# Patient Record
Sex: Female | Born: 1955 | Race: Black or African American | Hispanic: No | Marital: Married | Smoking: Never smoker
Health system: Southern US, Community
[De-identification: ages and names within clinical notes are randomized; demographics above are authoritative.]

## PROBLEM LIST (undated history)

## (undated) DIAGNOSIS — R51 Headache: Secondary | ICD-10-CM

## (undated) DIAGNOSIS — J309 Allergic rhinitis, unspecified: Secondary | ICD-10-CM

## (undated) DIAGNOSIS — Z87442 Personal history of urinary calculi: Secondary | ICD-10-CM

## (undated) HISTORY — DX: Personal history of urinary calculi: Z87.442

## (undated) HISTORY — DX: Headache: R51

## (undated) HISTORY — PX: CATARACT EXTRACTION: SUR2

## (undated) HISTORY — DX: Allergic rhinitis, unspecified: J30.9

---

## 1982-04-16 DIAGNOSIS — Z87442 Personal history of urinary calculi: Secondary | ICD-10-CM

## 1982-04-16 HISTORY — DX: Personal history of urinary calculi: Z87.442

## 2009-12-09 ENCOUNTER — Ambulatory Visit: Payer: Self-pay | Admitting: Family Medicine

## 2009-12-09 DIAGNOSIS — R519 Headache, unspecified: Secondary | ICD-10-CM | POA: Insufficient documentation

## 2009-12-09 DIAGNOSIS — Z87442 Personal history of urinary calculi: Secondary | ICD-10-CM | POA: Insufficient documentation

## 2009-12-09 DIAGNOSIS — R51 Headache: Secondary | ICD-10-CM | POA: Insufficient documentation

## 2009-12-09 DIAGNOSIS — J309 Allergic rhinitis, unspecified: Secondary | ICD-10-CM | POA: Insufficient documentation

## 2009-12-09 LAB — CONVERTED CEMR LAB
Cholesterol: 150 mg/dL (ref 0–200)
HDL: 54.2 mg/dL (ref >39.00)
LDL Cholesterol: 91 mg/dL (ref 0–99)
Triglycerides: 22 mg/dL (ref 0.0–149.0)

## 2009-12-26 ENCOUNTER — Encounter: Admission: RE | Admit: 2009-12-26 | Discharge: 2009-12-26 | Payer: Self-pay | Admitting: Family Medicine

## 2009-12-28 ENCOUNTER — Encounter (INDEPENDENT_AMBULATORY_CARE_PROVIDER_SITE_OTHER): Payer: Self-pay | Admitting: *Deleted

## 2010-05-16 NOTE — Letter (Signed)
Summary: Results Follow up Letter  [place] at [facility]  [address] [place], [place] [address]   Phone: [phone]  Fax: [phone]    12/28/2009 MRN: [id number]  Emily Preston [address] [place], [place]  [address]  Dear Ms. Woolverton,  The following are the results of your recent test(s):  Test         Result    Pap Smear:        Normal _____  Not Normal _____ Comments: ______________________________________________________ Cholesterol: LDL(Bad cholesterol):         Your goal is less than:         HDL (Good cholesterol):       Your goal is more than: Comments:  ______________________________________________________ Mammogram:        Normal __X___  Not Normal _____ Comments:Repeat in 1 year  ___________________________________________________________________ Hemoccult:        Normal _____  Not normal _______ Comments:    _____________________________________________________________________ Other Tests:    We routinely do not discuss normal results over the telephone.  If you desire a copy of the results, or you have any questions about this information we can discuss them at your next office visit.   Sincerely,      Kim Dance,CMA(AAMA)for Dr. Raechel Ache

## 2010-05-16 NOTE — Assessment & Plan Note (Signed)
Summary: NEW PT TO BE ESTABLISHED/JRR   Vital Signs:  Patient profile:   55 year old female LMP:     11/07/2009 Height:      60 inches Weight:      226.50 pounds BMI:     44.40 Temp:     98.4 degrees F oral Pulse rate:   88 / minute Pulse rhythm:   regular BP sitting:   132 / 90  (left arm) Cuff size:   large  Vitals Entered By: Delilah Shan CMA Duncan Dull) (December 09, 2009 10:06 AM) CC: New Patient to Establish LMP (date): 11/07/2009     Enter LMP: 11/07/2009   History of Present Illness: NP- Allergies and "sinus trouble".  No FCNAV, no ear pain.  Congested and HA intermittently but worse this summer.   FH CAD.  Pt hasn't seen MD in years.  Hasn't had chol/glucose checked.  No CP, no personal h/o CAD.  Due for mammogram.    Tdap and flu shots d/w patient.   Preventive Screening-Counseling & Management  Alcohol-Tobacco     Smoking Status: never  Caffeine-Diet-Exercise     Does Patient Exercise: no  Problems Prior to Update: 1)  Other Screening Mammogram  (ICD-V76.12) 2)  Family History of Ischemic Heart Disease  (ICD-V17.3) 3)  Nephrolithiasis, Hx of  (ICD-V13.01) 4)  Headache  (ICD-784.0) 5)  Allergic Rhinitis  (ICD-477.9)  Allergies (verified): No Known Drug Allergies  Past History:  Family History: Last updated: 12/09/2009 Family History Hypertension, parents F dead, HTN M dead, leg ulcers with skin grafts, "heart trouble"  Social History: Last updated: 12/09/2009 Marital Status: Married, 1992 Children: no Occupation: Public house manager, 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 Family History Hypertension, parents F dead, HTN M  dead, leg ulcers with skin grafts, "heart trouble"  Social History: Reviewed history and no changes required. 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  Otherwise negative.    Physical Exam  General:  GEN: nad, alert and oriented HEENT: mucous membranes moist, TM wnl bilaterally, nasal epithelium injected. OP wnl.  NECK: supple w/o LA CV: rrr.  no murmur PULM: ctab, no inc wob ABD: soft, +bs EXT: trace edema at ankles SKIN: no acute rash    Impression & Recommendations:  Problem # 1:  ALLERGIC RHINITIS (ICD-477.9) D/w patient re: zyrtec and nasal saline.  consider nasal steroids if not improving.    Problem # 2:  FAMILY HISTORY OF ISCHEMIC HEART DISEASE (ICD-V17.3) contact with labs.  Orders: TLB-Lipid Panel ([id number]) TLB-Glucose, QUANT ([id number])  Problem # 3:  OTHER SCREENING MAMMOGRAM (ICD-V76.12) refer for mammogram.  Orders: Radiology Referral (Radiology)  Patient Instructions: 1)  See Shirlee Limerick about your referral before your leave today.   2)  Check on your shot record.  I would get a flu shot this fall (and a tetanus shot if you haven't had one in 10 years). 3)  We'll contact you with your lab report.  4)  I would  like to see you back in the spring for a physical.  5)  I would use nasal saline and zyrtec 10mg  a day for your runny nose.  6)  Take care.   Prior Medications (reviewed today): None Current Allergies (reviewed today): No known allergies

## 2010-06-06 ENCOUNTER — Encounter: Payer: Self-pay | Admitting: Family Medicine

## 2010-07-14 ENCOUNTER — Other Ambulatory Visit: Payer: Self-pay

## 2010-07-25 ENCOUNTER — Encounter: Payer: Self-pay | Admitting: Family Medicine

## 2010-08-01 ENCOUNTER — Encounter: Payer: Self-pay | Admitting: Family Medicine

## 2011-01-16 ENCOUNTER — Other Ambulatory Visit: Payer: Self-pay | Admitting: Family Medicine

## 2011-01-16 DIAGNOSIS — Z1231 Encounter for screening mammogram for malignant neoplasm of breast: Secondary | ICD-10-CM

## 2011-01-29 ENCOUNTER — Ambulatory Visit: Payer: Self-pay

## 2011-02-09 ENCOUNTER — Inpatient Hospital Stay: Admission: RE | Admit: 2011-02-09 | Payer: Self-pay | Source: Ambulatory Visit

## 2011-03-16 ENCOUNTER — Ambulatory Visit (INDEPENDENT_AMBULATORY_CARE_PROVIDER_SITE_OTHER): Payer: BC Managed Care – PPO | Admitting: Family Medicine

## 2011-03-16 ENCOUNTER — Encounter: Payer: Self-pay | Admitting: Family Medicine

## 2011-03-16 ENCOUNTER — Other Ambulatory Visit (HOSPITAL_COMMUNITY)
Admission: RE | Admit: 2011-03-16 | Discharge: 2011-03-16 | Disposition: A | Discharge status: Home / Self Care | Payer: BC Managed Care – PPO | Source: Ambulatory Visit | Attending: Family Medicine | Admitting: Family Medicine

## 2011-03-16 DIAGNOSIS — Z0000 Encounter for general adult medical examination without abnormal findings: Secondary | ICD-10-CM

## 2011-03-16 DIAGNOSIS — Z23 Encounter for immunization: Secondary | ICD-10-CM

## 2011-03-16 DIAGNOSIS — Z1159 Encounter for screening for other viral diseases: Secondary | ICD-10-CM | POA: Insufficient documentation

## 2011-03-16 DIAGNOSIS — Z01419 Encounter for gynecological examination (general) (routine) without abnormal findings: Secondary | ICD-10-CM | POA: Insufficient documentation

## 2011-03-16 NOTE — Progress Notes (Signed)
CPE- See plan.  Routine anticipatory guidance given to patient.  See health maintenance.  Feeling well.  Sugar and cholesterol were fine on last check.   Due for mammogram, she'll call about this.   Due for pap.  Colon CA screening.  D/w patient [id number] for colon cancer screening, including IFOB vs. colonoscopy.  Risks and benefits of both were discussed and patient voiced understanding.  Pt elects for: IFOB.  We talked about diet and exercise.    Tetanus- due. Flu shot- due.  She declined.  Never had a flu shot.  She is aware of risk w/o vaccination.  She'll consider.   PMH and SH reviewed  Meds, vitals, and allergies reviewed.   ROS: See HPI.  Otherwise negative.    GEN: nad, alert and oriented HEENT: mucous membranes moist NECK: supple w/o LA CV: rrr. PULM: ctab, no inc wob ABD: soft, +bs, obese EXT: no edema SKIN: no acute rash Breast exam: No mass, nodules, thickening, tenderness, bulging, retraction, inflamation, nipple discharge or skin changes noted.  No axillary or clavicular LA.  Chaperoned exam.  Normal introitus for age, no external lesions, no vaginal discharge, mucosa pink and moist, no vaginal or cervical lesions (cervix was retroverted), no vaginal atrophy, no friaility or hemorrhage, normal uterus size and position, no adnexal masses or tenderness.  Chaperoned exam.  Pap collected.

## 2011-03-16 NOTE — Progress Notes (Signed)
Addended by: Annamarie Major on: 03/16/2011 01:13 PM   Modules accepted: Orders

## 2011-03-16 NOTE — Assessment & Plan Note (Signed)
IFOB sent with pt today.  Flu shot encouraged, declined.  Tdap today.  She'll call about mammogram.  Prev labs d/w pt.  D/w pt about diet and exercise.

## 2011-03-16 NOTE — Patient Instructions (Addendum)
I want you to call about your mammogram.  Walk more outside of work and try not to skip meals.   We'll contact you with your lab report. I would get a flu shot each fall.   Take care.

## 2011-03-23 ENCOUNTER — Encounter: Payer: Self-pay | Admitting: *Deleted

## 2011-04-11 ENCOUNTER — Other Ambulatory Visit: Payer: BC Managed Care – PPO

## 2011-04-11 ENCOUNTER — Other Ambulatory Visit: Payer: Self-pay | Admitting: Family Medicine

## 2011-04-11 DIAGNOSIS — Z1289 Encounter for screening for malignant neoplasm of other sites: Secondary | ICD-10-CM

## 2011-04-11 DIAGNOSIS — Z1211 Encounter for screening for malignant neoplasm of colon: Secondary | ICD-10-CM

## 2011-04-11 LAB — FECAL OCCULT BLOOD, IMMUNOCHEMICAL: Fecal Occult Bld: NEGATIVE

## 2011-04-13 ENCOUNTER — Encounter: Payer: Self-pay | Admitting: *Deleted

## 2012-09-02 ENCOUNTER — Other Ambulatory Visit: Payer: Self-pay

## 2012-09-02 DIAGNOSIS — Z1231 Encounter for screening mammogram for malignant neoplasm of breast: Secondary | ICD-10-CM

## 2012-10-13 ENCOUNTER — Inpatient Hospital Stay: Admission: RE | Admit: 2012-10-13 | Payer: BC Managed Care – PPO | Source: Ambulatory Visit

## 2012-11-10 ENCOUNTER — Ambulatory Visit: Payer: BC Managed Care – PPO

## 2014-02-15 ENCOUNTER — Encounter: Payer: Self-pay | Admitting: Family Medicine

## 2015-06-28 ENCOUNTER — Ambulatory Visit: Payer: Self-pay | Admitting: Family Medicine

## 2015-07-08 ENCOUNTER — Ambulatory Visit: Payer: 59 | Admitting: Family Medicine

## 2015-08-01 ENCOUNTER — Ambulatory Visit (INDEPENDENT_AMBULATORY_CARE_PROVIDER_SITE_OTHER): Payer: 59 | Admitting: Family Medicine

## 2015-08-01 ENCOUNTER — Encounter: Payer: Self-pay | Admitting: Family Medicine

## 2015-08-01 ENCOUNTER — Other Ambulatory Visit: Payer: Self-pay | Admitting: Family Medicine

## 2015-08-01 VITALS — BP 130/80 | HR 92 | Temp 98.4°F | Ht 60.0 in | Wt 261.5 lb

## 2015-08-01 DIAGNOSIS — Z1211 Encounter for screening for malignant neoplasm of colon: Secondary | ICD-10-CM | POA: Diagnosis not present

## 2015-08-01 DIAGNOSIS — Z119 Encounter for screening for infectious and parasitic diseases, unspecified: Secondary | ICD-10-CM

## 2015-08-01 DIAGNOSIS — Z131 Encounter for screening for diabetes mellitus: Secondary | ICD-10-CM

## 2015-08-01 DIAGNOSIS — Z13220 Encounter for screening for lipoid disorders: Secondary | ICD-10-CM

## 2015-08-01 DIAGNOSIS — Z0000 Encounter for general adult medical examination without abnormal findings: Secondary | ICD-10-CM | POA: Diagnosis not present

## 2015-08-01 DIAGNOSIS — Z7189 Other specified counseling: Secondary | ICD-10-CM

## 2015-08-01 NOTE — Progress Notes (Signed)
Pre visit review using our clinic review tool, if applicable. No additional management support is needed unless otherwise documented below in the visit note.  CPE- See plan.  Routine anticipatory guidance given to patient.  See health maintenance. DXA not due, d/w pt.  Shingles and PNA shots, not due, d/w pt.  Tetanus 2012 Flu shot d/w pt.  Mammogram d/w pt.  Due.   See AVS.  Living will d/w pt.  Sister Kennyth Lose designated if patient were incapacitated.   D/w patient [id number] for colon cancer screening, including IFOB vs. colonoscopy.  Risks and benefits of both were discussed and patient voiced understanding.  Pt elects [id number].  Pt opts in for HCV and HIV screening.  D/w pt re: routine screening.   Pap declined.  Menopausal for years.  No VB.  Occ hot flashes, at baseline.  No FCNAVD.  Recheck BP: 130/80.   D/w pt about diet and exercise.  Diet is affected by her work schedule.  She is walking more.  She has been commuting to [place] after her separation and she is trying to work around that.   Some BLE edema noted after a long day at work, mild sx.  Not noted on other days.  D/w pt.   PMH and SH reviewed  Meds, vitals, and allergies reviewed.   ROS: See HPI.  Otherwise negative.    GEN: nad, alert and oriented HEENT: mucous membranes moist NECK: supple w/o LA CV: rrr. PULM: ctab, no inc wob ABD: soft, +bs EXT: no edema

## 2015-08-01 NOTE — Patient Instructions (Addendum)
Go to the lab on the way out.  We'll contact you with your lab report. Take care.  Glad to see you.   You can call for a mammogram at these locations:  [facility] at [facility].  [facility] [place] [facility] [place] [phone]  See about getting some TED hose for work.    [place] [address], [place], [place]  [phone]  [facility] [address] [place], [place]  [phone]

## 2015-08-02 ENCOUNTER — Telehealth: Payer: Self-pay | Admitting: Infectious Disease

## 2015-08-02 DIAGNOSIS — Z7189 Other specified counseling: Secondary | ICD-10-CM | POA: Insufficient documentation

## 2015-08-02 LAB — HIV 1/2 CONFIRMATION
HIV 2 AB: NEGATIVE
HIV-1 antibody: NEGATIVE

## 2015-08-02 LAB — HIV ANTIBODY (ROUTINE TESTING W REFLEX): HIV 1&2 Ab, 4th Generation: REACTIVE — AB

## 2015-08-02 LAB — BASIC METABOLIC PANEL
BUN: 12 mg/dL (ref 6–23)
CHLORIDE: 102 meq/L (ref 96–112)
CO2: 31 meq/L (ref 19–32)
CREATININE: 0.69 mg/dL (ref 0.40–1.20)
Calcium: 9.5 mg/dL (ref 8.4–10.5)
GFR: 112.64 mL/min (ref >60.00)
GLUCOSE: 87 mg/dL (ref 70–99)
POTASSIUM: 3.9 meq/L (ref 3.5–5.1)
SODIUM: 138 meq/L (ref 135–145)

## 2015-08-02 LAB — HEPATITIS C ANTIBODY: HCV Ab: NEGATIVE

## 2015-08-02 LAB — LDL CHOLESTEROL, DIRECT: Direct LDL: 95 mg/dL

## 2015-08-02 NOTE — Telephone Encounter (Signed)
Patient with HIV 4th generation test prelim + but with discriminatory ab negative  This is either ACUTE HIV or a FALSE + HIV EIA from first step of test. THe qualitative RNA is not back yet  Perhaps we can check with the lab?   Horace Porteous lets discuss since this would be a potential acute. She was screened as part of routine Health maintenance so not clear any ssx of acute HIV. I reallly wish we would just go to the ultraquant for screeniing already and then do the 4th gen out of interest in who might be acute or not

## 2015-08-02 NOTE — Assessment & Plan Note (Signed)
DXA not due, d/w pt.  Shingles and PNA shots, not due, d/w pt.  Tetanus 2012 Flu shot d/w pt.  Mammogram d/w pt.  Due.   See AVS.  Living will d/w pt.  Sister Kennyth Lose designated if patient were incapacitated.   D/w patient [id number] for colon cancer screening, including IFOB vs. colonoscopy.  Risks and benefits of both were discussed and patient voiced understanding.  Pt elects [id number].  Pt opts in for HCV and HIV screening.  D/w pt re: routine screening.   Pap declined.  Menopausal for years.  No VB.  Occ hot flashes, at baseline.  No FCNAVD.  Recheck BP: 130/80.   D/w pt about diet and exercise.  Diet is affected by her work schedule.  She is walking more.  She has been commuting to [place] after her separation and she is trying to work around that.   Some BLE edema noted after a long day at work, mild sx.  Not noted on other days.  D/w pt. Would be reasonable to try TED hose since her BP is fine, edema is minimal when noted- none on exam, and she is ctab on exam today.  She agrees.

## 2015-08-08 ENCOUNTER — Telehealth: Payer: Self-pay | Admitting: Infectious Disease

## 2015-08-08 NOTE — Telephone Encounter (Signed)
Dr. Damita Dunnings this patient either has true ACUTE HIV infection or a false + test. I can still see that her quantitative RNA which would distinguish between the two tests is still not back over 7 days-- a MAJOR PROBLEM with the newer assay that is being used is this turn around.   Do you have much clinical suspciion for acute HIV. If she has acute HIV we would want to get her on treatment ASAP and we even have a trial where she could be put on meds fairly quickly  (if she was still within the 7 days of her test it would be immediately) and have free meds for a year. Regardless IF she has acute HIV she would want to get on meds immediately esp in terms of reducing risk of transmission to others.   If we could do an ultraquant RNA here in [place] it should come back in 2 days.   This is something that you could probably do in your clinic or we could do it at ours.  Would you like me to reach out to her re this? We can also wait another 1-2 days but this is getting fairly protracted.

## 2015-08-09 ENCOUNTER — Telehealth: Payer: Self-pay | Admitting: Family Medicine

## 2015-08-09 DIAGNOSIS — Z114 Encounter for screening for human immunodeficiency virus [HIV]: Secondary | ICD-10-CM

## 2015-08-09 LAB — HIV-1 RNA, QUALITATIVE, TMA

## 2015-08-09 NOTE — Telephone Encounter (Signed)
I wouldn't necessarily tell her she doesn't have HIV unless we have the answer with an RNA test because IF she is + she will be supremely contagious with sexual activity

## 2015-08-09 NOTE — Telephone Encounter (Signed)
I just found out that there was a QNS on the sample, but they didn't contact us up front.  We'll contact the patient about a redraw for a stat run.   I've not yet contacted the patient, but I'll call her today.  Thanks.

## 2015-08-09 NOTE — Telephone Encounter (Signed)
Unfortunately there is no way to speed it up for some stupid reason all of the labs that use the newer assays fly the specimens to [place] for testing. SO incredibly stupid. We could do a ultraquant RNA in [place] and would be back in 2 days. I am starting to think we should use that test rather than 4th gen or Geniius for HIV testing since there have been so many problems recently with timely reporting. Only downside to HIV ultrquant testing is it would miss HIV2 which is INSANELY rare.  Lets go ahead and wait. Hopefully they dont lose the specimen or something ridiculous. I will ask my phlebotomist if she can find out more

## 2015-08-09 NOTE — Telephone Encounter (Signed)
I called pt.  I told her the screen was pos, first confirmation was neg, and QNS on the second.  I think she does NOT have HIV and I told her that.   She'll come by tomorrow for f/u testing.  She thanked me for the call.   I thanked her for taking the call.  I told her that her other labs are fine.    I routed this to ID as a FYI.  App help of all involved.

## 2015-08-09 NOTE — Telephone Encounter (Signed)
I would do an ultraquantitative RNA dont do a qual--it will probably take just as long. ULTRA QUANT RNA WILL TURN AROUND IN 2 DAYS

## 2015-08-09 NOTE — Telephone Encounter (Signed)
You ordered the correct test this test peformance is just unacceptable from lab. I think we need to just drop 4th gen testing and do HIV ultraquants for screening on everyone to avoid this nonsense. Thanks for helping take care of this. YOu can see they cancelled the QUAL part of the test. Nice of them to let us know 10 days later!

## 2015-08-09 NOTE — Telephone Encounter (Addendum)
I have it ordered as RNA ultra quant.  Please check the order that I have and let me know if that isn't correct.  Thanks.

## 2015-08-09 NOTE — Telephone Encounter (Signed)
I think her risk is low and I don't suspect acute HIV.   I thought the RNA result would be back yesterday or today.   I was awaiting that result to call the patient. I was going to call your clinic today if I hadn't heard anything.  Do you have any idea when the results will be back? Is there a way to speed up the process? I would prefer to use the test that is already in process and not bring the patient back for a second blood draw.  I would prefer to be able to call her with final results, since I think her risk is low.  Thank you for your input.  Please let me know what you think.

## 2015-08-10 ENCOUNTER — Other Ambulatory Visit (INDEPENDENT_AMBULATORY_CARE_PROVIDER_SITE_OTHER): Payer: 59

## 2015-08-10 ENCOUNTER — Telehealth: Payer: Self-pay | Admitting: Family Medicine

## 2015-08-10 DIAGNOSIS — Z114 Encounter for screening for human immunodeficiency virus [HIV]: Secondary | ICD-10-CM | POA: Diagnosis not present

## 2015-08-10 NOTE — Telephone Encounter (Signed)
Craig,[facility],returned Dr.Duncan's call.  Please call Cecilie Lowers back at [phone] ext [phone].  He said he'll be there until 5:00 or a little after.

## 2015-08-11 LAB — HIV-1 RNA ULTRAQUANT REFLEX TO GENTYP+
HIV 1 RNA Quant: <20 copies/mL (ref <20)
HIV-1 RNA Quant, Log: <1.3 Log copies/mL (ref <1.30)

## 2015-08-15 NOTE — Telephone Encounter (Signed)
I called Cecilie Lowers back.  He is going to check on protocols for send out labs re: QNS eval, prior to the send out.   He'll notify the secondary lab to call me to discuss.   I appreciate the help of all involved.

## 2015-11-18 ENCOUNTER — Ambulatory Visit: Payer: 59 | Admitting: Family Medicine

## 2015-12-14 ENCOUNTER — Encounter: Payer: Self-pay | Admitting: Family Medicine

## 2015-12-14 ENCOUNTER — Ambulatory Visit (INDEPENDENT_AMBULATORY_CARE_PROVIDER_SITE_OTHER): Payer: 59 | Admitting: Family Medicine

## 2015-12-14 VITALS — BP 140/78 | HR 90 | Temp 98.6°F | Wt 260.5 lb

## 2015-12-14 DIAGNOSIS — R110 Nausea: Secondary | ICD-10-CM | POA: Diagnosis not present

## 2015-12-14 MED ORDER — RANITIDINE HCL 150 MG PO TABS: 150.0000 mg | ORAL_TABLET | Freq: Two times a day (BID) | ORAL | 1 refills | Status: DC | PRN

## 2015-12-14 NOTE — Progress Notes (Signed)
"  stomach trouble".  "Back in '88 or '90" ie years ago, she had recurrent intermittent nausea.  She was tested for H pylori and she was positive.  Was treated and did well.   In the meantime, she had some smilar troubles about 4-5 years ago.  That resolved with only pepto, no other intervention.  Now with return of sx in the last 3 weeks, no relief with OTC meds.    Some discomfort, L lower abd, sometimes on the R side.  Nausea w/o vomiting.  No blood in stool.  No black stools.  Intermittent discomfort and nausea.  No fevers.  No exotic food or travel.  No one else sick with similar.  No PPI use.    She still has her GB.  No FH of similar sx.  No clear trigger foods.    Meds, vitals, and allergies reviewed.   ROS: Per HPI unless specifically indicated in ROS section   GEN: nad, alert and oriented HEENT: mucous membranes moist NECK: supple w/o LA CV: rrr PULM: ctab, no inc wob ABD: soft, +bs EXT: trace edema

## 2015-12-14 NOTE — Patient Instructions (Addendum)
Go to the lab on the way out.  We'll contact you with your lab report. Try taking zantac in the meantime.  If may be cheaper over the counter.  Update me as needed.  Limit ibuprofen use.  Try to take tylenol instead.  Take care.  Glad to see you.

## 2015-12-14 NOTE — Progress Notes (Signed)
Pre visit review using our clinic review tool, if applicable. No additional management support is needed unless otherwise documented below in the visit note. 

## 2015-12-14 NOTE — Assessment & Plan Note (Signed)
She feels like she did with prev H pylori, d/w pt.  Reasonable to recheck stool study for H pylori.  Check basic labs in the meantime.  She has nonspecific sx but presumed gastric irritation.  No sign of ominous dx.  abd exam benign.  She isn't on PPI.  D/w pt.  Can start zantac.  Limit ibuprofen in the meantime.  She agrees.  Okay for outpatient f/u.

## 2015-12-15 LAB — COMPREHENSIVE METABOLIC PANEL
ALK PHOS: 90 U/L (ref 39–117)
ALT: 10 U/L (ref 0–35)
AST: 16 U/L (ref 0–37)
Albumin: 4 g/dL (ref 3.5–5.2)
BUN: 9 mg/dL (ref 6–23)
CHLORIDE: 103 meq/L (ref 96–112)
CO2: 31 meq/L (ref 19–32)
Calcium: 9 mg/dL (ref 8.4–10.5)
Creatinine, Ser: 0.74 mg/dL (ref 0.40–1.20)
GFR: 103.77 mL/min (ref >60.00)
GLUCOSE: 84 mg/dL (ref 70–99)
POTASSIUM: 3.6 meq/L (ref 3.5–5.1)
SODIUM: 138 meq/L (ref 135–145)
Total Bilirubin: 0.4 mg/dL (ref 0.2–1.2)
Total Protein: 7.7 g/dL (ref 6.0–8.3)

## 2015-12-15 LAB — CBC WITH DIFFERENTIAL/PLATELET
BASOS PCT: 0.3 % (ref 0.0–3.0)
Basophils Absolute: 0 10*3/uL (ref 0.0–0.1)
EOS PCT: 2.5 % (ref 0.0–5.0)
Eosinophils Absolute: 0.1 10*3/uL (ref 0.0–0.7)
HCT: 37.8 % (ref 36.0–46.0)
Hemoglobin: 12.4 g/dL (ref 12.0–15.0)
LYMPHS ABS: 1.6 10*3/uL (ref 0.7–4.0)
Lymphocytes Relative: 31.3 % (ref 12.0–46.0)
MCHC: 32.9 g/dL (ref 30.0–36.0)
MCV: 86.4 fl (ref 78.0–100.0)
MONOS PCT: 3.6 % (ref 3.0–12.0)
Monocytes Absolute: 0.2 10*3/uL (ref 0.1–1.0)
NEUTROS ABS: 3.2 10*3/uL (ref 1.4–7.7)
NEUTROS PCT: 62.3 % (ref 43.0–77.0)
Platelets: 225 10*3/uL (ref 150.0–400.0)
RBC: 4.37 Mil/uL (ref 3.87–5.11)
RDW: 15.5 % (ref 11.5–15.5)
WBC: 5.2 10*3/uL (ref 4.0–10.5)

## 2015-12-27 NOTE — Addendum Note (Signed)
Addended by: Ellamae Sia on: 12/27/2015 01:01 PM   Modules accepted: Orders

## 2017-09-17 ENCOUNTER — Encounter: Payer: 59 | Admitting: Family Medicine

## 2018-02-18 ENCOUNTER — Ambulatory Visit (INDEPENDENT_AMBULATORY_CARE_PROVIDER_SITE_OTHER): Payer: PRIVATE HEALTH INSURANCE | Admitting: Family Medicine

## 2018-02-18 ENCOUNTER — Encounter: Payer: Self-pay | Admitting: Family Medicine

## 2018-02-18 DIAGNOSIS — M25579 Pain in unspecified ankle and joints of unspecified foot: Secondary | ICD-10-CM | POA: Diagnosis not present

## 2018-02-18 DIAGNOSIS — D3500 Benign neoplasm of unspecified adrenal gland: Secondary | ICD-10-CM | POA: Diagnosis not present

## 2018-02-18 DIAGNOSIS — Z87442 Personal history of urinary calculi: Secondary | ICD-10-CM

## 2018-02-18 DIAGNOSIS — R030 Elevated blood-pressure reading, without diagnosis of hypertension: Secondary | ICD-10-CM

## 2018-02-18 NOTE — Patient Instructions (Addendum)
Likely tendonitis.  Use a lace up ankle brace.   Ice for 5 minutes at a time.   Update me if not any better.  Take ibuprofen 400-600mg  up to 3 times a day with food.   Take care.  Glad to see you.   Check your BP out of clinic and update me if consistently >140/>90.

## 2018-02-18 NOTE — Progress Notes (Signed)
L ankle pain.  Pain for about 2 weeks.  Intermittent pain.  No trauma.  No injury.  Medial ankle pain.  No bruising, no redness.  Pain with walking.  Much less pain at rest.  Some pain with ROM when not weight bearing.    She had used compression stockings at baseline with some relief of edema.    She is working 12 hour shifts, often working 3 days in a row.  Then she'll have some days off.    She tried ibuprofen with some relief.    She isn't having sx from renal stones.  D/w pt. she did have CT scan that showed an adrenal adenoma.  I want to look back at her records and I told her we would be in touch.  Her blood pressure was elevated today and she is going to check her blood pressure outside of the clinic and update me if is persistently elevated.  Flu shot encouraged, declined. Encourage mammogram.  Discussed with patient.  Meds, vitals, and allergies reviewed.   ROS: Per HPI unless specifically indicated in ROS section   nad ncat Neck supple, no LA rrr ctab abd soft, not ttp Left ankle with normal inspection.  Medial and lateral malleolar line not tender.  Achilles and calcaneus nontender.  She is slightly tender anterior and distal to the medial malleolus.  Midfoot is not tender to palpation otherwise.  Fifth metatarsal nontender.  Distally neurovascular intact.  Intact dorsalis pedis pulse.  No bruising.  No rash.

## 2018-02-19 DIAGNOSIS — M25579 Pain in unspecified ankle and joints of unspecified foot: Secondary | ICD-10-CM | POA: Insufficient documentation

## 2018-02-19 DIAGNOSIS — D3500 Benign neoplasm of unspecified adrenal gland: Secondary | ICD-10-CM | POA: Insufficient documentation

## 2018-02-19 DIAGNOSIS — R030 Elevated blood-pressure reading, without diagnosis of hypertension: Secondary | ICD-10-CM | POA: Insufficient documentation

## 2018-02-19 NOTE — Assessment & Plan Note (Signed)
She will check blood pressure out of clinic and update me if persistently elevated.

## 2018-02-19 NOTE — Assessment & Plan Note (Signed)
No symptoms currently.

## 2018-02-19 NOTE — Assessment & Plan Note (Signed)
Likely dorsal/medial tendinitis.  I was not concerned for fracture.  Neither was the patient.  Reasonable to try icing and using lace up ankle brace.  Ice for 5 minutes at a time.  She can get an ankle brace over-the-counter.  Update me if not better.  She agrees.

## 2018-02-19 NOTE — Assessment & Plan Note (Signed)
I will to check back on her old records and we will be in touch.  Discussed with patient.  She agrees.

## 2018-03-17 ENCOUNTER — Telehealth: Payer: Self-pay | Admitting: Family Medicine

## 2018-03-17 NOTE — Telephone Encounter (Signed)
Noted. Thanks.  Will await the f/u OV.

## 2018-03-17 NOTE — Telephone Encounter (Signed)
I need update on patient.   If her BP is still >140/>90, then she needs f/u here.   She has an adrenal adenoma.  If she has normal BP, then this likely doesn't need extra intervention.  If her BP is still elevated, then it needs extra investigation.    Thanks.  I'll await update from patient.

## 2018-03-17 NOTE — Telephone Encounter (Signed)
Patient says BP is slowly coming down and is close to the range of 140/90.  Patient says she is still having some ankle pain and made an appointment to come back in next week to check both.

## 2018-03-27 ENCOUNTER — Ambulatory Visit (INDEPENDENT_AMBULATORY_CARE_PROVIDER_SITE_OTHER): Payer: PRIVATE HEALTH INSURANCE | Admitting: Family Medicine

## 2018-03-27 ENCOUNTER — Encounter: Payer: Self-pay | Admitting: Family Medicine

## 2018-03-27 ENCOUNTER — Ambulatory Visit (INDEPENDENT_AMBULATORY_CARE_PROVIDER_SITE_OTHER)
Admission: RE | Admit: 2018-03-27 | Discharge: 2018-03-27 | Disposition: A | Discharge status: Home / Self Care | Payer: PRIVATE HEALTH INSURANCE | Source: Ambulatory Visit | Attending: Family Medicine | Admitting: Family Medicine

## 2018-03-27 VITALS — BP 146/96 | HR 104 | Temp 97.8°F | Ht 60.0 in | Wt 255.5 lb

## 2018-03-27 DIAGNOSIS — M25579 Pain in unspecified ankle and joints of unspecified foot: Secondary | ICD-10-CM | POA: Diagnosis not present

## 2018-03-27 DIAGNOSIS — D3500 Benign neoplasm of unspecified adrenal gland: Secondary | ICD-10-CM | POA: Diagnosis not present

## 2018-03-27 NOTE — Patient Instructions (Addendum)
Go to the lab on the way out.  We'll contact you with your lab and xray report.  Use the ankle brace at least for the first half of a shift.  You made need to loosen it up mid shift.    Take care.  Glad to see you.  We may need to get you over to the urology clinic.

## 2018-03-27 NOTE — Progress Notes (Signed)
She was able to get an ankle brace and tried that.  It was helping a lot initially but the effect wore off.  She is about where she was when she came in.   Standing for a long time seems to make it worse.  She has a 4 day stretch coming up and that is likely going to be a strain.    She checked BP a few times.  150s/80s out of clinic.    She assumed she passed the prev renal stone but she didn't see it pass.  The pain stopped.  No urinary symptoms now.  Meds, vitals, and allergies reviewed.   ROS: Per HPI unless specifically indicated in ROS section   nad ncat Mmm rrr ctab Abd soft, not ttp L ankle slightly puffy but able to bear weight ttp near medial malleolus Not tender to palpation near the lateral malleolus. Midfoot not tender. Grossly neurovascularly intact.

## 2018-03-28 LAB — BASIC METABOLIC PANEL
BUN: 10 mg/dL (ref 6–23)
CALCIUM: 9.4 mg/dL (ref 8.4–10.5)
CO2: 27 mEq/L (ref 19–32)
Chloride: 104 mEq/L (ref 96–112)
Creatinine, Ser: 0.68 mg/dL (ref 0.40–1.20)
GFR: 113.5 mL/min (ref >60.00)
GLUCOSE: 85 mg/dL (ref 70–99)
POTASSIUM: 3.8 meq/L (ref 3.5–5.1)
Sodium: 140 mEq/L (ref 135–145)

## 2018-03-30 LAB — ALDOSTERONE: ALDOSTERONE: 2 ng/dL

## 2018-03-31 NOTE — Assessment & Plan Note (Signed)
Her blood pressure today was slightly elevated.  I want to check aldosterone level and repeat labs and we will be in contact with the patient.  Rationale discussed with patient.

## 2018-03-31 NOTE — Assessment & Plan Note (Signed)
Check plain films.  Unlikely to have a fracture at this point but reasonable to exclude this as much is possibly can be done with plain films.  Continue using ankle brace but she may need to loosen it up some during the middle of the day, especially after she has been standing for a long time.  Ice as needed.

## 2018-04-07 ENCOUNTER — Telehealth: Payer: Self-pay | Admitting: Family Medicine

## 2018-04-07 DIAGNOSIS — D3500 Benign neoplasm of unspecified adrenal gland: Secondary | ICD-10-CM

## 2018-04-07 DIAGNOSIS — Z87442 Personal history of urinary calculi: Secondary | ICD-10-CM

## 2018-04-07 NOTE — Telephone Encounter (Signed)
I had called over to [facility] about patient being seen.  It may be best for her to go see uro.  I put in the referral.  Thanks.   Marland Kitchen

## 2018-04-08 NOTE — Telephone Encounter (Signed)
Patient notified as instructed by telephone and verbalized understanding. Advised patient that one of the referral coordinators will be in touch to get th is set up.

## 2018-05-08 ENCOUNTER — Ambulatory Visit: Payer: Self-pay | Admitting: Urology

## 2018-06-05 ENCOUNTER — Ambulatory Visit: Payer: Self-pay | Admitting: Urology

## 2018-07-08 ENCOUNTER — Ambulatory Visit: Payer: Self-pay | Admitting: Urology

## 2018-08-05 ENCOUNTER — Other Ambulatory Visit: Payer: Self-pay

## 2018-08-05 ENCOUNTER — Telehealth: Payer: Self-pay | Admitting: Urology

## 2018-08-05 ENCOUNTER — Telehealth (INDEPENDENT_AMBULATORY_CARE_PROVIDER_SITE_OTHER): Payer: Self-pay | Admitting: Urology

## 2018-08-05 DIAGNOSIS — D3502 Benign neoplasm of left adrenal gland: Secondary | ICD-10-CM

## 2018-08-05 NOTE — Telephone Encounter (Signed)
-----   Message from Billey Co, MD sent at 08/05/2018 12:33 PM EDT ----- Regarding: follow up Please schedule follow up in 3 months with CT A/P(ordered), thanks  Nickolas Madrid, MD 08/05/2018

## 2018-08-05 NOTE — Telephone Encounter (Signed)
Appt made and mailed to pt

## 2018-08-05 NOTE — Progress Notes (Signed)
Virtual Visit via Telephone Note  I connected with Osie Bond on 08/05/18 at 12:30 PM EDT by telephone and verified that I am speaking with the correct person using two identifiers.   I discussed the limitations, risks, security and privacy concerns of performing an evaluation and management service by telephone and the availability of in person appointments. We discussed the impact of the COVID-19 on the healthcare system, and the importance of social distancing and reducing patient and provider exposure. I also discussed with the patient that there may be a patient responsible charge related to this service. The patient expressed understanding and agreed to proceed.  Reason for visit: Left 2cm adrenal adenoma  History of Present Illness: Ms. Laflam is a 63 year old otherwise healthy African-American female that is referred by Dr. Damita Dunnings for a incidentally found left-sided 2 cm adrenal adenoma.  This was incidentally found on a CT scan 05/2017 performed for flank pain that additionally showed a 1 mm distal ureteral stone, as well as a reported left-sided 2 cm adrenal adenoma.  The actual images are unavailable for me to personally review.  There is no prior imaging to compare.  She has had some intermittent hypertension, as high as 160/100, as well as some significant weight gain recently.  She is not undergone endocrine testing for this adenoma.  She denies any flank pain or urinary symptoms.  She has a history of 2-3 episodes of nephrolithiasis spaced approximately 10 years apart.  She denies any family history of urologic malignancies.  Assessment and Plan: In summary, the patient is a 63 year old healthy female with an incidentally found 2 cm left adrenal adenoma in February 2019 on CT scan.  There is no prior imaging to review.  She has not undergone endocrine work-up.  We discussed that these are typically benign lesions, however guidelines recommend baseline endocrine testing for  any patient with an adrenal incidentaloma, as well as consideration of follow-up imaging at 1 year if there is no prior imaging to evaluate stability.  Follow Up: -Referral placed for full endocrine evaluation, especially in the setting of hypertension and weight gain -We will obtain outside CT from February 2019 -RTC 3 months with CT abdomen/pelvis without contrast   I discussed the assessment and treatment plan with the patient. The patient was provided an opportunity to ask questions and all were answered. The patient agreed with the plan and demonstrated an understanding of the instructions.   The patient was advised to call back or seek an in-person evaluation if the symptoms worsen or if the condition fails to improve as anticipated.  I provided 15 minutes of non-face-to-face time during this encounter.   Billey Co, MD

## 2018-08-06 ENCOUNTER — Telehealth: Payer: Self-pay | Admitting: Urology

## 2018-08-06 NOTE — Telephone Encounter (Signed)
-----   Message from Billey Co, MD sent at 08/05/2018 12:19 PM EDT ----- Regarding: obtain outside CT Please obtain hard disc of CT A/P from 05/2017 from [facility] for review, thanks  Nickolas Madrid, MD 08/05/2018

## 2018-08-06 NOTE — Telephone Encounter (Signed)
Spoke with patient and she will call back to get the mailing address and then she will call and request the CD from [facility] and have it sent to [place].   Sharyn Lull

## 2018-08-14 ENCOUNTER — Ambulatory Visit: Payer: Self-pay | Admitting: Urology

## 2018-08-29 ENCOUNTER — Ambulatory Visit (INDEPENDENT_AMBULATORY_CARE_PROVIDER_SITE_OTHER): Payer: PRIVATE HEALTH INSURANCE | Admitting: Internal Medicine

## 2018-08-29 ENCOUNTER — Encounter: Payer: Self-pay | Admitting: Internal Medicine

## 2018-08-29 ENCOUNTER — Other Ambulatory Visit: Payer: Self-pay

## 2018-08-29 VITALS — BP 148/82 | HR 98 | Temp 97.6°F | Ht 60.0 in | Wt 260.0 lb

## 2018-08-29 DIAGNOSIS — D3502 Benign neoplasm of left adrenal gland: Secondary | ICD-10-CM

## 2018-08-29 DIAGNOSIS — E010 Iodine-deficiency related diffuse (endemic) goiter: Secondary | ICD-10-CM

## 2018-08-29 LAB — TSH: TSH: 2.37 u[IU]/mL (ref 0.35–4.50)

## 2018-08-29 LAB — T4, FREE: Free T4: 0.94 ng/dL (ref 0.60–1.60)

## 2018-08-29 LAB — POTASSIUM: Potassium: 3.9 mEq/L (ref 3.5–5.1)

## 2018-08-29 MED ORDER — DEXAMETHASONE 1 MG PO TABS: 1.0000 mg | ORAL_TABLET | Freq: Once | ORAL | 0 refills | Status: AC

## 2018-08-29 NOTE — Progress Notes (Signed)
Name: Emily Preston  MRN/ DOB: [id number], April 14, 1956    Age/ Sex: 63 y.o., female    PCP: Tonia Ghent, MD   Reason for Endocrinology Evaluation: Adrenal Adenoma     Date of Initial Endocrinology Evaluation: 08/29/2018     HPI: Ms. Emily Preston is a 63 y.o. female with a past medical history of HTN and nephrolithiasis. The patient presented for initial endocrinology clinic visit on 08/29/2018 for consultative assistance with her left adrenal adenoma.   Pt was referred her by Dr. Diamantina Providence (Urology ) for functional evaluation of a 2 cm left adrenal adenoma, which was found on CT scan during evaluation of flank pain in 05/2017. She was also found to have a ureteral stone.      Adrenal incidentaloma: Substantial weight gain- She has gained weight in 2017 but has been stable since Centripetal obesity  yes Easy bruisability  no Severe hypertension no DM no  Virilization no Proximal muscle weakness  no Fatigue sometimes  Sudden/ severe headaches  no Weight loss no Anxiety attacks no Sweating no Cardiac arrhythmias  no Palpitations no Fluid retention  yes Hypokalemia no     HISTORY:  Past Medical History:  Past Medical History:  Diagnosis Date  . Allergic rhinitis, cause unspecified   . Headache(784.0)   . Personal history of urinary calculi 1984   Past Surgical History:  Past Surgical History:  Procedure Laterality Date  . CESAREAN SECTION  1978      Social History:  reports that she has never smoked. She has never used smokeless tobacco. She reports that she does not drink alcohol or use drugs.  Family History: family history includes Breast cancer in her maternal aunt; Hypertension in her father and mother.   HOME MEDICATIONS: Allergies as of 08/29/2018   No Known Allergies     Medication List    as of Aug 29, 2018  1:39 PM   You have not been prescribed any medications.       REVIEW OF SYSTEMS: A comprehensive ROS was  conducted with the patient and is negative except as per HPI and below:  Review of Systems  Constitutional: Negative for fever.  HENT: Positive for congestion. Negative for sore throat.   Respiratory: Negative for cough and shortness of breath.   Cardiovascular: Positive for leg swelling.  Gastrointestinal: Negative for diarrhea and nausea.  Genitourinary: Negative for frequency.  Musculoskeletal: Positive for myalgias. Negative for joint pain.  Skin: Negative.   Neurological: Negative for tingling and tremors.  Endo/Heme/Allergies: Negative for polydipsia.  Psychiatric/Behavioral: Negative for depression. The patient is not nervous/anxious.        OBJECTIVE:  VS: BP (!) 148/82 (BP Location: Left Arm, Patient Position: Sitting, Cuff Size: Normal)   Pulse 98   Temp 97.6 F (36.4 C)   Ht 5' (1.524 m)   Wt 260 lb (117.9 kg)   SpO2 98%   BMI 50.78 kg/m    Wt Readings from Last 3 Encounters:  08/29/18 260 lb (117.9 kg)  03/27/18 255 lb 8 oz (115.9 kg)  02/18/18 257 lb 4 oz (116.7 kg)     EXAM: General: Pt appears well and is in NAD  Hydration: Well-hydrated with moist mucous membranes and good skin turgor  Eyes: External eye exam normal without stare, lid lag or exophthalmos.  EOM intact.  PERRL.  Ears, Nose, Throat: Hearing: Grossly intact bilaterally Dental: Good dentition  Throat: Clear without mass, erythema or exudate  Neck: General:  Supple without adenopathy. Thyroid: Thyroid size normal.  ? Left thyroid  nodules appreciated. No thyroid bruit.  Lungs: Clear with good BS bilat with no rales, rhonchi, or wheezes  Heart: Auscultation: RRR.  Abdomen: Normoactive bowel sounds, soft, nontender, without masses or organomegaly palpable  Extremities:  BL LE: No pretibial edema normal ROM and strength.  Skin: Hair: Texture and amount normal with gender appropriate distribution Skin Inspection: No rashes. Skin Palpation: Skin temperature, texture, and thickness normal to  palpation  Neuro:  DTRs: 2+ and symmetric in UE without delay in relaxation phase  Mental Status: Judgment, insight: Intact Orientation: Oriented to time, place, and person Mood and affect: No depression, anxiety, or agitation     DATA REVIEWED:    Results for NIRALYA, OHANIAN (MRN [id number]) as of 09/01/2018 12:01  Ref. Range 08/29/2018 14:04  Potassium Latest Ref Range: 3.5 - 5.1 mEq/L 3.9  Metanephrine, Pl Latest Ref Range: <=57 pg/mL 25  Normetanephrine, Pl Latest Ref Range: <=148 pg/mL 80  TSH Latest Ref Range: 0.35 - 4.50 uIU/mL 2.37  T4,Free(Direct) Latest Ref Range: 0.60 - 1.60 ng/dL 0.94   ASSESSMENT/PLAN/RECOMMENDATIONS:   1. Left Adrenal Adenoma :  - Eighty five percent of adrenal adenomas are nonsecretory.  - Three forms of adrenal hyperfunction should be considered in patients with adrenal incidentaloma   Glucocorticoid hypersecretion  Primary hyperaldosteronism  Pheochromocytoma  - Images are not available at this time - Will need repeat imaging in 6-12 months  - Will proceed with aldo:renin and metanephrine testing today  - Will perform dexamethasone testing , she was provided with a printed lab order for cortisol to be done locally    2. Thyromegaly:   - She is clinically euthyroid  - Will obtain TFT's - Will proceed with ultrasound to evaluate the left lobe abnormality , ? Left lobe nodule     F/u in 6 months     Addendum: TFT's and metanephrines are normal. Aldo and cortisol are pending    Signed electronically by: Mack Guise, MD  [facility]  [facility] [address] [place], [place] [address] Phone: [phone] FAX: [phone]   CC: Tonia Ghent, MD [place] [address] Phone: [phone] Fax: [phone]   Return to Endocrinology clinic as below: Future Appointments  Date Time Provider Siren  11/07/2018  8:00 AM [facility]-CT  [facility]-CT [facility]-Out[facility]  11/13/2018  1:45 PM Billey Co, MD [facility] None

## 2018-08-29 NOTE — Patient Instructions (Signed)
Instructions for Dexamethasone Suppression Test   Step 1: Choose a morning when you can come to our lab at 8:00 am for a blood draw.   Step 2: On the night before the blood draw, take 1 mg tablet of dexamethasone at 11:30 pm.  The timing is VERY important!   Step 3: The next morning, go to the lab for blood work at 8:00 am.  [name] do not have to be on an empty stomach, but the timing is VERY important!

## 2018-09-01 LAB — METANEPHRINES, PLASMA
Metanephrine, Free: 25 pg/mL (ref <=57)
Normetanephrine, Free: 80 pg/mL (ref <=148)
Total Metanephrines-Plasma: 105 pg/mL (ref <=205)

## 2018-09-02 ENCOUNTER — Encounter: Payer: Self-pay | Admitting: Internal Medicine

## 2018-09-02 NOTE — Progress Notes (Signed)
letter

## 2018-09-03 LAB — ALDOSTERONE + RENIN ACTIVITY W/O RATIO
ALDOSTERONE: 2 ng/dL (ref 0.0–30.0)
Renin: <0.167 ng/mL/hr — ABNORMAL LOW (ref 0.167–5.380)

## 2018-09-30 ENCOUNTER — Other Ambulatory Visit: Payer: Self-pay | Admitting: Internal Medicine

## 2018-10-06 ENCOUNTER — Telehealth: Payer: Self-pay | Admitting: Internal Medicine

## 2018-10-06 ENCOUNTER — Encounter: Payer: Self-pay | Admitting: Internal Medicine

## 2018-10-06 NOTE — Telephone Encounter (Signed)
Received dexamethasone suppression test results as below     09/30/2018 Cortisol 0.7 ug/dL    A letter will be sent to the patient.    Abby Nena Jordan, MD  [facility] Endocrin[facility]  [facility] [address] [place],  [address] Phone: [phone] FAX: [phone]

## 2018-10-08 LAB — CORTISOL: Cortisol: 0.7 ug/dL

## 2018-11-07 ENCOUNTER — Ambulatory Visit: Payer: PRIVATE HEALTH INSURANCE

## 2018-11-13 ENCOUNTER — Ambulatory Visit: Payer: Self-pay | Admitting: Urology

## 2019-03-06 ENCOUNTER — Ambulatory Visit: Payer: PRIVATE HEALTH INSURANCE | Admitting: Internal Medicine

## 2019-03-19 ENCOUNTER — Ambulatory Visit: Payer: PRIVATE HEALTH INSURANCE | Admitting: Internal Medicine

## 2019-09-24 ENCOUNTER — Encounter: Payer: Self-pay | Admitting: Family Medicine

## 2019-09-24 ENCOUNTER — Ambulatory Visit (INDEPENDENT_AMBULATORY_CARE_PROVIDER_SITE_OTHER): Payer: PRIVATE HEALTH INSURANCE | Admitting: Family Medicine

## 2019-09-24 ENCOUNTER — Other Ambulatory Visit: Payer: Self-pay

## 2019-09-24 VITALS — BP 154/98 | HR 101 | Temp 97.4°F | Ht 60.0 in | Wt 255.0 lb

## 2019-09-24 DIAGNOSIS — D3500 Benign neoplasm of unspecified adrenal gland: Secondary | ICD-10-CM

## 2019-09-24 DIAGNOSIS — Z1211 Encounter for screening for malignant neoplasm of colon: Secondary | ICD-10-CM | POA: Diagnosis not present

## 2019-09-24 DIAGNOSIS — Z0000 Encounter for general adult medical examination without abnormal findings: Secondary | ICD-10-CM | POA: Diagnosis not present

## 2019-09-24 DIAGNOSIS — R609 Edema, unspecified: Secondary | ICD-10-CM

## 2019-09-24 DIAGNOSIS — E010 Iodine-deficiency related diffuse (endemic) goiter: Secondary | ICD-10-CM | POA: Diagnosis not present

## 2019-09-24 DIAGNOSIS — Z7189 Other specified counseling: Secondary | ICD-10-CM

## 2019-09-24 DIAGNOSIS — R030 Elevated blood-pressure reading, without diagnosis of hypertension: Secondary | ICD-10-CM | POA: Diagnosis not present

## 2019-09-24 LAB — LIPID PANEL
Cholesterol: 165 mg/dL (ref 0–200)
HDL: 52 mg/dL (ref >39.00)
LDL Cholesterol: 104 mg/dL — ABNORMAL HIGH (ref 0–99)
NonHDL: 112.68
Total CHOL/HDL Ratio: 3
Triglycerides: 43 mg/dL (ref 0.0–149.0)
VLDL: 8.6 mg/dL (ref 0.0–40.0)

## 2019-09-24 LAB — COMPREHENSIVE METABOLIC PANEL
ALT: 8 U/L (ref 0–35)
AST: 14 U/L (ref 0–37)
Albumin: 4 g/dL (ref 3.5–5.2)
Alkaline Phosphatase: 84 U/L (ref 39–117)
BUN: 17 mg/dL (ref 6–23)
CO2: 30 mEq/L (ref 19–32)
Calcium: 9.3 mg/dL (ref 8.4–10.5)
Chloride: 103 mEq/L (ref 96–112)
Creatinine, Ser: 0.67 mg/dL (ref 0.40–1.20)
GFR: 108.09 mL/min (ref >60.00)
Glucose, Bld: 92 mg/dL (ref 70–99)
Potassium: 3.9 mEq/L (ref 3.5–5.1)
Sodium: 138 mEq/L (ref 135–145)
Total Bilirubin: 0.4 mg/dL (ref 0.2–1.2)
Total Protein: 7.6 g/dL (ref 6.0–8.3)

## 2019-09-24 LAB — TSH: TSH: 2.52 u[IU]/mL (ref 0.35–4.50)

## 2019-09-24 LAB — T4, FREE: Free T4: 0.89 ng/dL (ref 0.60–1.60)

## 2019-09-24 NOTE — Progress Notes (Signed)
This visit occurred during the SARS-CoV-2 public health emergency.  Safety protocols were in place, including screening questions prior to the visit, additional usage of staff PPE, and extensive cleaning of exam room while observing appropriate contact time as indicated for disinfecting solutions.  CPE- See plan.  Routine anticipatory guidance given to patient.  See health maintenance.  The possibility exists that previously documented standard health maintenance information may have been brought forward from a previous encounter into this note.  If needed, that same information has been updated to reflect the current situation based on today's encounter.    DXA not due, d/w pt.  PNA shot not due, d/w pt.  Shingles d/w pt.   Tetanus 2012 Flu shot d/w pt.  Done at work.   covid vaccine- Moderna x2 2021 Mammogram d/w pt.  Due.   See AVS.  Living will d/w pt.  Sister Kennyth Lose designated if patient were incapacitated.   D/w patient JS:[id number] for colon cancer screening, including IFOB vs. colonoscopy.  Risks and benefits of both were discussed and patient voiced understanding.  Pt elects PFY:TWKM.  Pap d/w pt.  No h/o abnormals.  Menopausal for years.  No VB.  Occ hot flashes, at baseline.  Deferred by patient.  History of thyromegaly noted.  Follow-up labs pending.  D/w pt about renal adenoma and endo/uro follow up.  She will call to schedule appointments.  She has checked her BP out of clinic and was at goal per patient report.   D/w pt about diet and exercise.  Diet is affected by her work schedule.  She is walking more.  She is still commuting to work after her separation.  Some BLE edema noted after a long day at work.  Not noted as much on other days.  D/w pt. Would be reasonable to try hose.  rx written for patient.  10-31mmHg compression, knee high.   PMH and SH reviewed  Meds, vitals, and allergies reviewed.   ROS: Per HPI.  Unless specifically indicated otherwise in HPI, the patient  denies:  General: fever. Eyes: acute vision changes ENT: sore throat Cardiovascular: chest pain Respiratory: SOB GI: vomiting GU: dysuria Musculoskeletal: acute back pain Derm: acute rash Neuro: acute motor dysfunction Psych: worsening mood Endocrine: polydipsia Heme: bleeding Allergy: hayfever  GEN: nad, alert and oriented HEENT: ncat NECK: supple w/o LA, I do not feel any asymmetric masses in the throat. CV: rrr. PULM: ctab, no inc wob ABD: soft, +bs EXT: 1+ BLE edema SKIN: no acute rash  See after visit summary.

## 2019-09-24 NOTE — Patient Instructions (Addendum)
Thanks for getting a covid shot.   Check with your insurance to see if they will cover the shingles shot.  You can call for a mammogram at the [facility] [phone]  Go to the lab on the way out.   If you have mychart we'll likely use that to update you.     Call about follow up with endocrine and urology when possible.    See about getting compression stockings in the meantime for the ankle swelling, especially on work days.   Take care.  Glad to see you.

## 2019-09-27 DIAGNOSIS — R609 Edema, unspecified: Secondary | ICD-10-CM | POA: Insufficient documentation

## 2019-09-27 NOTE — Assessment & Plan Note (Signed)
Living will d/w pt.  Sister Jackie designated if patient were incapacitated. ?

## 2019-09-27 NOTE — Assessment & Plan Note (Signed)
Some BLE edema noted after a long day at work.  Not noted as much on other days.  D/w pt. Would be reasonable to try hose.  rx written for patient.  10-53mmHg compression, knee high.  See notes on labs.

## 2019-09-27 NOTE — Assessment & Plan Note (Signed)
D/w pt about renal adenoma and endo/uro follow up.  She will call to schedule appointments.

## 2019-09-27 NOTE — Assessment & Plan Note (Signed)
DXA not due, d/w pt.  PNA shot not due, d/w pt.  Shingles d/w pt.   Tetanus 2012 Flu shot d/w pt.  Done at work.   covid vaccine- Moderna x2 2021 Mammogram d/w pt.  Due.   See AVS.  Living will d/w pt.  Sister Kennyth Lose designated if patient were incapacitated.   D/w patient [id number] for colon cancer screening, including IFOB vs. colonoscopy.  Risks and benefits of both were discussed and patient voiced understanding.  Pt elects KJZ:PHXT.  Pap d/w pt.  No h/o abnormals.  Menopausal for years.  No VB.  Occ hot flashes, at baseline.

## 2019-09-27 NOTE — Assessment & Plan Note (Signed)
Controlled on outside check per patient report.  Discussed.  She can update me as needed.

## 2020-07-21 IMAGING — DX DG ANKLE COMPLETE 3+V*L*
3 series · 3 of 3 positions shown · non-contrast
Comparison: None.

CLINICAL DATA: Left ankle pain.  No injury.

EXAM:
LEFT ANKLE COMPLETE - 3+ VIEW

[ankle ap]
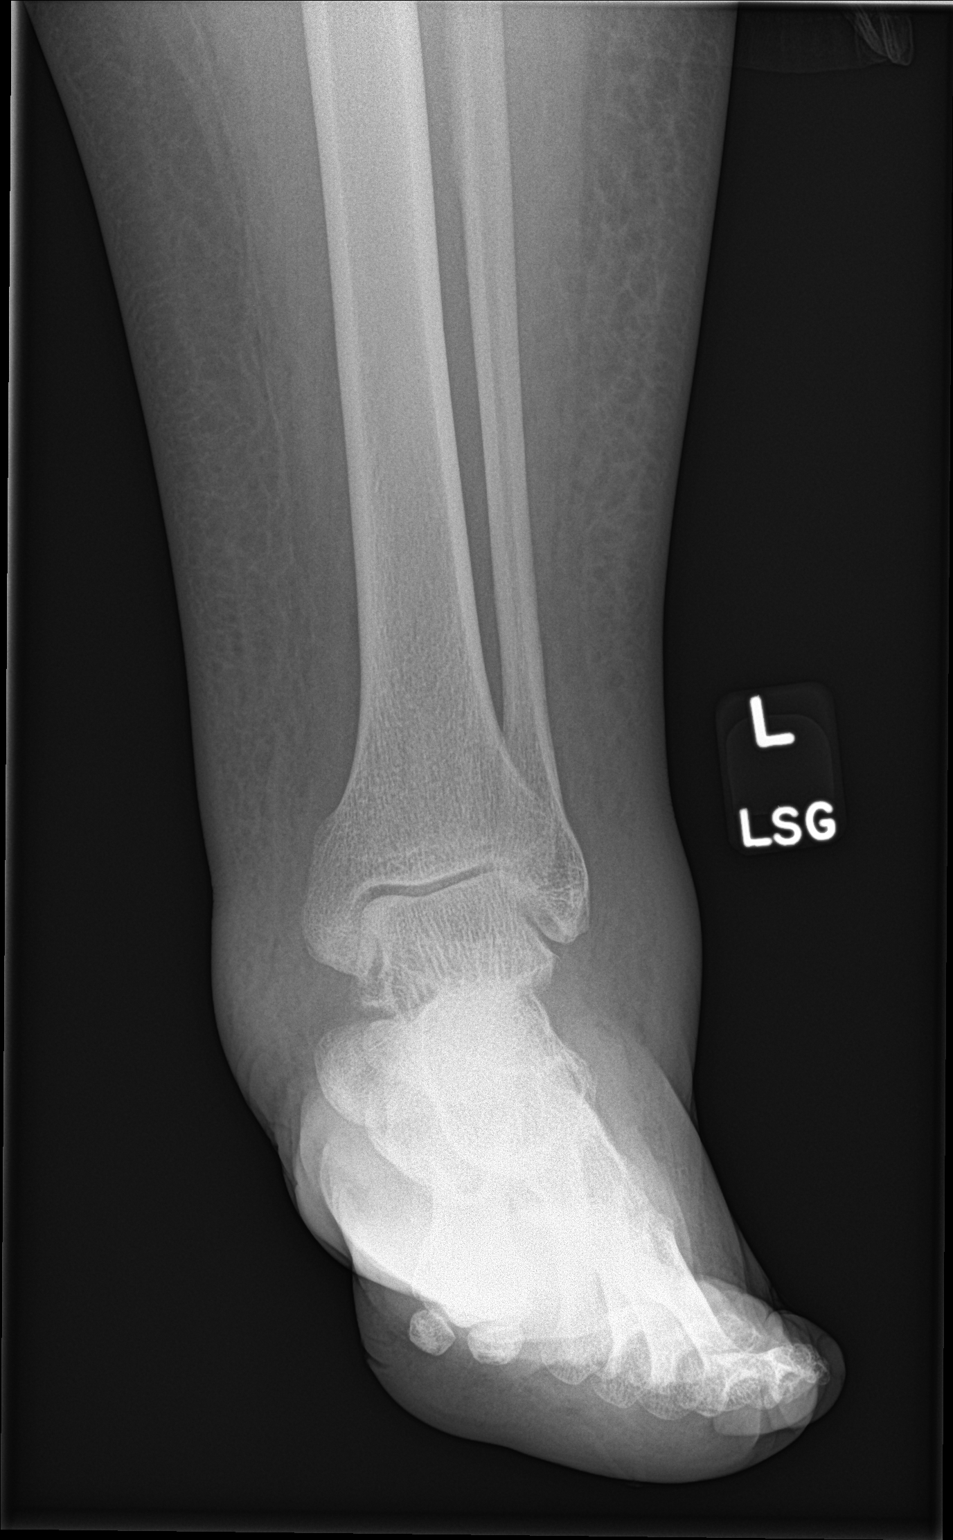

[ankle obl]
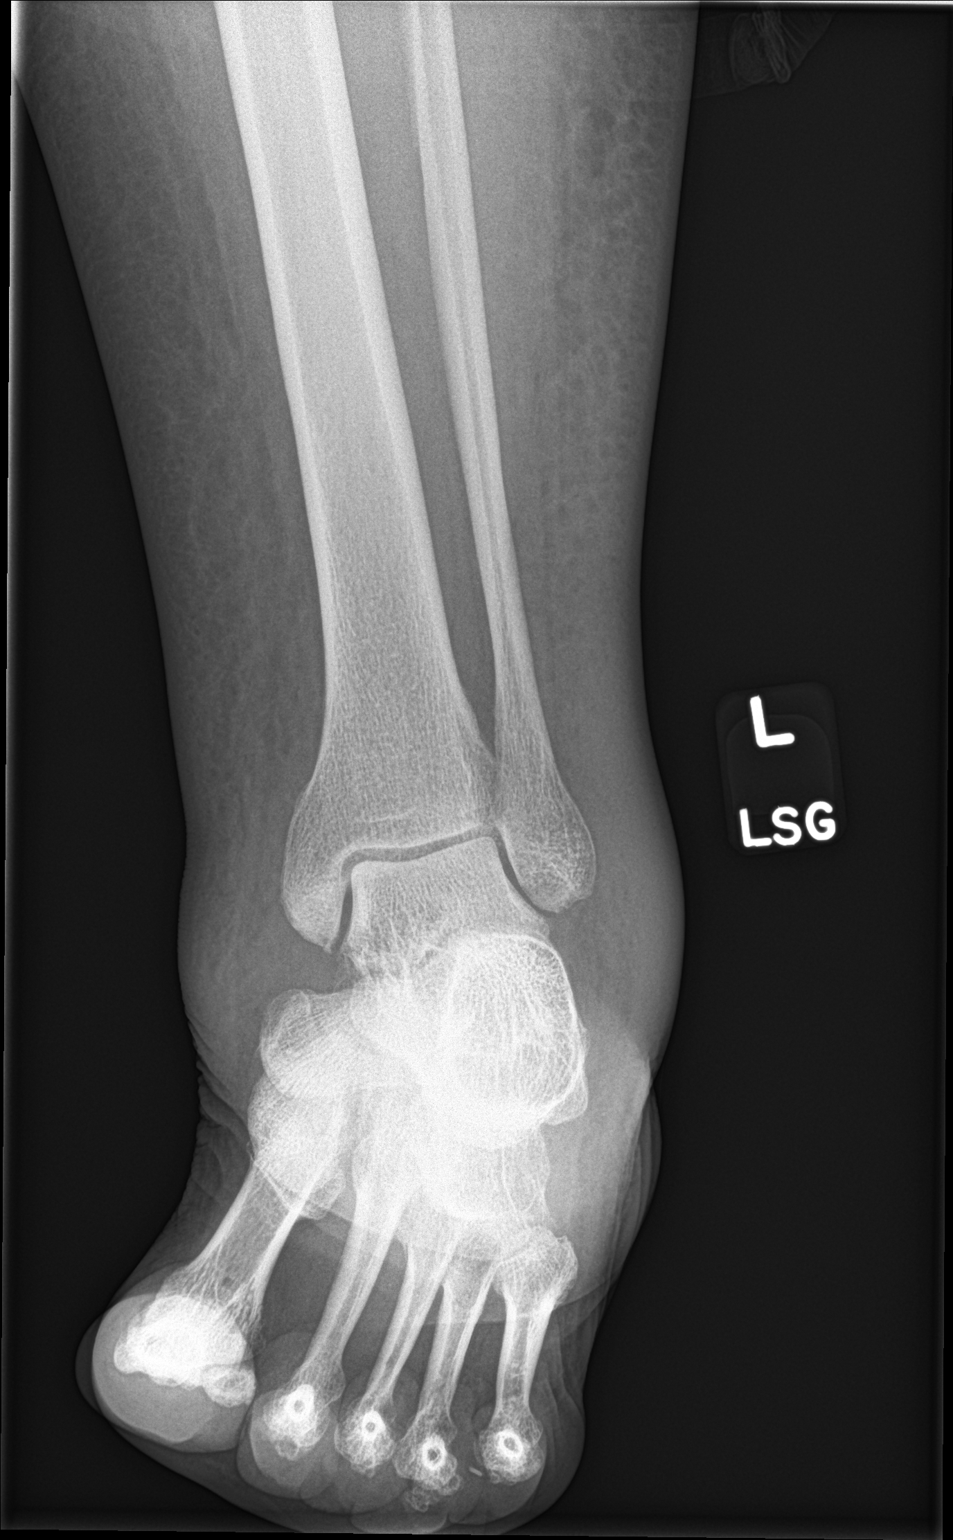

[ankle lat]
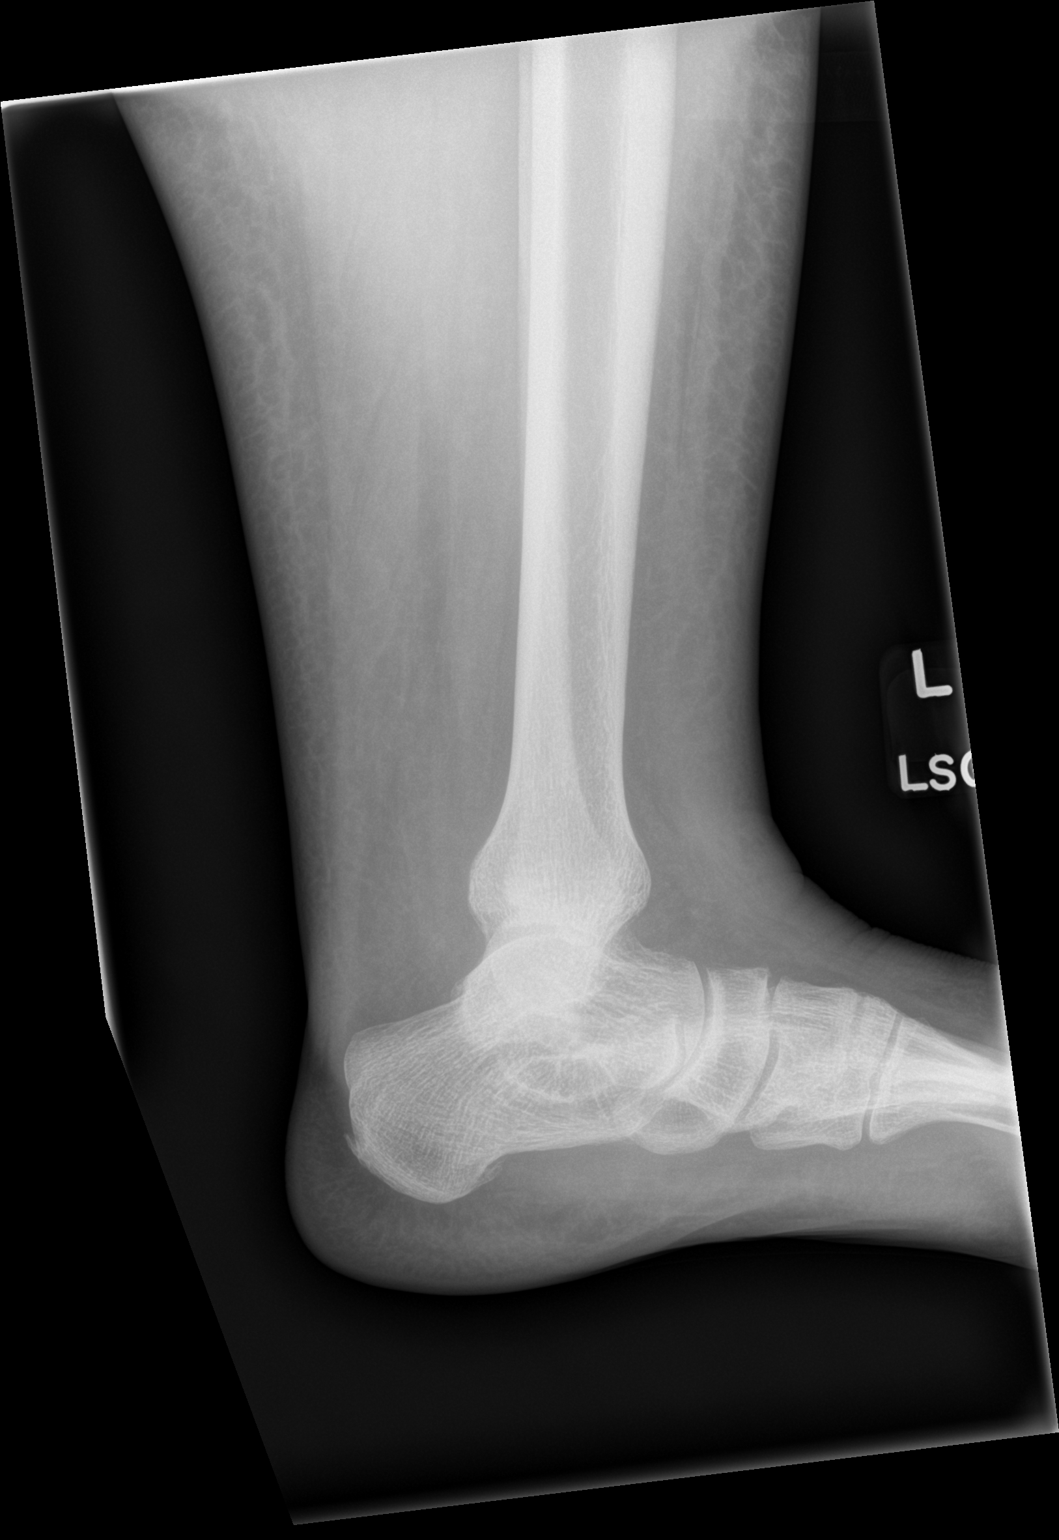

[3 of 3 positions shown; findings below may reference images not displayed]

FINDINGS: No acute fracture or dislocation. The ankle mortise is symmetric.
The talar dome is intact. No tibiotalar joint effusion. Joint spaces
are preserved. Small plantar and Achilles enthesophytes. Diffuse
soft tissue swelling of the lower leg.
IMPRESSION: 1. No acute osseous abnormality or significant degenerative changes.
2. Diffuse soft tissue swelling of the lower leg and ankle.

## 2021-08-18 ENCOUNTER — Encounter: Payer: PRIVATE HEALTH INSURANCE | Admitting: Family Medicine

## 2021-08-20 ENCOUNTER — Other Ambulatory Visit: Payer: Self-pay | Admitting: Family Medicine

## 2021-08-20 DIAGNOSIS — E010 Iodine-deficiency related diffuse (endemic) goiter: Secondary | ICD-10-CM

## 2021-08-20 DIAGNOSIS — R030 Elevated blood-pressure reading, without diagnosis of hypertension: Secondary | ICD-10-CM

## 2021-08-21 ENCOUNTER — Other Ambulatory Visit (INDEPENDENT_AMBULATORY_CARE_PROVIDER_SITE_OTHER): Payer: PRIVATE HEALTH INSURANCE

## 2021-08-21 DIAGNOSIS — E010 Iodine-deficiency related diffuse (endemic) goiter: Secondary | ICD-10-CM

## 2021-08-21 DIAGNOSIS — R030 Elevated blood-pressure reading, without diagnosis of hypertension: Secondary | ICD-10-CM

## 2021-08-22 LAB — COMPREHENSIVE METABOLIC PANEL
ALT: 10 U/L (ref 0–35)
AST: 15 U/L (ref 0–37)
Albumin: 4.1 g/dL (ref 3.5–5.2)
Alkaline Phosphatase: 93 U/L (ref 39–117)
BUN: 11 mg/dL (ref 6–23)
CO2: 30 mEq/L (ref 19–32)
Calcium: 9.4 mg/dL (ref 8.4–10.5)
Chloride: 102 mEq/L (ref 96–112)
Creatinine, Ser: 0.69 mg/dL (ref 0.40–1.20)
GFR: 92.37 mL/min (ref >60.00)
Glucose, Bld: 80 mg/dL (ref 70–99)
Potassium: 3.8 mEq/L (ref 3.5–5.1)
Sodium: 140 mEq/L (ref 135–145)
Total Bilirubin: 0.5 mg/dL (ref 0.2–1.2)
Total Protein: 7.6 g/dL (ref 6.0–8.3)

## 2021-08-22 LAB — LIPID PANEL
Cholesterol: 177 mg/dL (ref 0–200)
HDL: 61.8 mg/dL (ref >39.00)
LDL Cholesterol: 107 mg/dL — ABNORMAL HIGH (ref 0–99)
NonHDL: 115.07
Total CHOL/HDL Ratio: 3
Triglycerides: 42 mg/dL (ref 0.0–149.0)
VLDL: 8.4 mg/dL (ref 0.0–40.0)

## 2021-08-22 LAB — TSH: TSH: 2.73 u[IU]/mL (ref 0.35–5.50)

## 2021-08-22 LAB — T4, FREE: Free T4: 0.95 ng/dL (ref 0.60–1.60)

## 2021-08-24 ENCOUNTER — Ambulatory Visit (INDEPENDENT_AMBULATORY_CARE_PROVIDER_SITE_OTHER): Payer: PRIVATE HEALTH INSURANCE | Admitting: Family Medicine

## 2021-08-24 ENCOUNTER — Encounter: Payer: Self-pay | Admitting: Family Medicine

## 2021-08-24 VITALS — BP 144/80 | HR 97 | Temp 97.9°F | Ht 60.0 in | Wt 253.0 lb

## 2021-08-24 DIAGNOSIS — Z1239 Encounter for other screening for malignant neoplasm of breast: Secondary | ICD-10-CM

## 2021-08-24 DIAGNOSIS — Z7189 Other specified counseling: Secondary | ICD-10-CM

## 2021-08-24 DIAGNOSIS — Z0000 Encounter for general adult medical examination without abnormal findings: Secondary | ICD-10-CM | POA: Diagnosis not present

## 2021-08-24 DIAGNOSIS — Z1211 Encounter for screening for malignant neoplasm of colon: Secondary | ICD-10-CM

## 2021-08-24 NOTE — Progress Notes (Signed)
CPE- See plan.  Routine anticipatory guidance given to patient.  See health maintenance.  The possibility exists that previously documented standard health maintenance information may have been brought forward from a previous encounter into this note.  If needed, that same information has been updated to reflect the current situation based on today's encounter.   ? ?DXA not due, d/w pt.  ?PNA shot not due, d/w pt.  ?Shingles d/w pt.   ?Tetanus 2012 ?Flu shot d/w pt.  Done at work.   ?covid vaccine- Moderna x2 2021 ?Mammogram d/w pt.  ?Living will d/w pt.  Sister Kennyth Lose designated if patient were incapacitated.   ?D/w patient VO:HYWVPXT for colon cancer screening, including IFOB vs. colonoscopy.  Risks and benefits of both were discussed and patient voiced understanding.  Pt elects GGY:[id number].  Ordered 2023.   ?Pap d/w pt- she deferred 08/24/21.  No h/o abnormals.  Menopausal for years.  No VB.  Occ hot flashes, at baseline.  ? ?Labs d/w pt.   ? ?Flat arches noted, d/w pt about arch support inserts.  She can update me as needed. ? ?Discussed with patient about trying to get a mammogram set up in [place]. ? ?BP is lower at home.  She can update me if persistently elevated ? ?PMH and SH reviewed ? ?Meds, vitals, and allergies reviewed.  ? ?ROS: Per HPI.  Unless specifically indicated otherwise in HPI, the patient denies: ? ?General: fever. ?Eyes: acute vision changes ?ENT: sore throat ?Cardiovascular: chest pain ?Respiratory: SOB ?GI: vomiting ?GU: dysuria ?Musculoskeletal: acute back pain ?Derm: acute rash ?Neuro: acute motor dysfunction ?Psych: worsening mood ?Endocrine: polydipsia ?Heme: bleeding ?Allergy: hayfever ? ?GEN: nad, alert and oriented ?HEENT: ncat ?NECK: supple w/o LA ?CV: rrr. ?PULM: ctab, no inc wob ?ABD: soft, +bs ?EXT: no edema ?SKIN: no acute rash ?Flat arches noted bilaterally. ?

## 2021-08-24 NOTE — Patient Instructions (Addendum)
I'll check on mammogram options  ? ?Check with your insurance to see if they will cover the shingles and tetanus shots.  ? ?Try soft full length arch support inserts.  [employer] makes green inserts.   ? ?We'll get cologuard sent to you.   ? ?Take care.  Glad to see you. ? ?

## 2021-08-27 ENCOUNTER — Telehealth: Payer: Self-pay | Admitting: Family Medicine

## 2021-08-27 DIAGNOSIS — D3500 Benign neoplasm of unspecified adrenal gland: Secondary | ICD-10-CM

## 2021-08-27 NOTE — Assessment & Plan Note (Signed)
DXA not due, d/w pt.  ?PNA shot not due, d/w pt.  ?Shingles d/w pt.   ?Tetanus 2012 ?Flu shot d/w pt.  Done at work.   ?covid vaccine- Moderna x2 2021 ?Mammogram d/w pt.  ?Living will d/w pt.  Sister Kennyth Lose designated if patient were incapacitated.   ?D/w patient [id number] for colon cancer screening, including IFOB vs. colonoscopy.  Risks and benefits of both were discussed and patient voiced understanding.  Pt elects [id number].  Ordered 2023.   ?Pap d/w pt- she deferred 08/24/21.  No h/o abnormals.  Menopausal for years.  No VB.  Occ hot flashes, at baseline.  ? ?Labs d/w pt.   ?

## 2021-08-27 NOTE — Telephone Encounter (Signed)
Please contact patient.  1 issue not discussed at recent office visit-please have her see about follow-up with endocrinology regarding adrenal adenoma.  Thanks. ?

## 2021-08-27 NOTE — Assessment & Plan Note (Signed)
Living will d/w pt.  Sister Kennyth Lose designated if patient were incapacitated. ?

## 2021-08-30 NOTE — Telephone Encounter (Signed)
lmtcb

## 2021-09-05 NOTE — Telephone Encounter (Signed)
Spoke with patient and its been seen 2020 since she saw endo for this and will need a new referral done.

## 2021-09-06 NOTE — Telephone Encounter (Signed)
Please update patient about this message and see if she is willing to see other clinic.  Thanks.

## 2021-09-06 NOTE — Telephone Encounter (Signed)
I put in the referral.  Thanks.  

## 2021-09-06 NOTE — Addendum Note (Signed)
Addended by: Tonia Ghent on: 09/06/2021 02:16 PM   Modules accepted: Orders

## 2021-09-06 NOTE — Telephone Encounter (Signed)
Reminder for Endocrinology referrals:   [facility] is not taking patients so all referrals have to go to [facility] right now. They have not started seeing patients again since they are so short.    [facility] is booking out to Nov for Diabetes Management but are working in Thyroid patients on a case by case basis so there is no real time frame for those appt. Its based on chart review to determine urgency and scheduling time frame.   [facility] is booking out to Nov for everything, not seeing urgent referrals - unless there is a cancellation and the patient can be seen sooner(rare).     Referral will need to be sent to [facility] to see if able to work in [place] can send to [facility] if you feel this needs to be seen asap. Need to know as well if the patient is okay with going outside of [facility] if need be to [facility]. He may have to contact his insurance to make see who is in Network.

## 2021-09-08 NOTE — Telephone Encounter (Signed)
Left message for pt to call back  °

## 2021-09-08 NOTE — Telephone Encounter (Signed)
Spoke to pt. She said she lives in the [place] area. I suggested she call around and see if she can find something closer to her. She works 12 hr shifts. So I advised it may be better for her if they will let her make an appt and we can send the referral and notes as needed.

## 2022-01-05 LAB — COLOGUARD: COLOGUARD: NEGATIVE

## 2022-12-27 ENCOUNTER — Encounter: Payer: PRIVATE HEALTH INSURANCE | Admitting: Family Medicine

## 2023-07-26 ENCOUNTER — Ambulatory Visit (INDEPENDENT_AMBULATORY_CARE_PROVIDER_SITE_OTHER): Payer: PRIVATE HEALTH INSURANCE | Admitting: Family Medicine

## 2023-07-26 ENCOUNTER — Encounter: Payer: Self-pay | Admitting: Family Medicine

## 2023-07-26 VITALS — BP 160/80 | HR 89 | Temp 98.5°F | Ht 59.13 in | Wt 251.6 lb

## 2023-07-26 DIAGNOSIS — Z23 Encounter for immunization: Secondary | ICD-10-CM | POA: Diagnosis not present

## 2023-07-26 DIAGNOSIS — E2839 Other primary ovarian failure: Secondary | ICD-10-CM | POA: Diagnosis not present

## 2023-07-26 DIAGNOSIS — R030 Elevated blood-pressure reading, without diagnosis of hypertension: Secondary | ICD-10-CM

## 2023-07-26 DIAGNOSIS — R609 Edema, unspecified: Secondary | ICD-10-CM

## 2023-07-26 DIAGNOSIS — Z7189 Other specified counseling: Secondary | ICD-10-CM

## 2023-07-26 DIAGNOSIS — Z1231 Encounter for screening mammogram for malignant neoplasm of breast: Secondary | ICD-10-CM

## 2023-07-26 DIAGNOSIS — E010 Iodine-deficiency related diffuse (endemic) goiter: Secondary | ICD-10-CM

## 2023-07-26 DIAGNOSIS — Z0000 Encounter for general adult medical examination without abnormal findings: Secondary | ICD-10-CM

## 2023-07-26 NOTE — Progress Notes (Signed)
 CPE- See plan.  Routine anticipatory guidance given to patient.  See health maintenance.  The possibility exists that previously documented standard health maintenance information may have been brought forward from a previous encounter into this note.  If needed, that same information has been updated to reflect the current situation based on today's encounter.    DXA and mammogram ordered 2025.  PNA-20, done 2025.   Shingles d/w pt.   Tetanus 2012, d/w pt.   Flu shot d/w pt.  Done at work.   covid vaccine- Moderna x2 2021 Living will d/w pt.  Sister Fredrik Jensen designated if patient were incapacitated.   Cologuard neg 2023.   Pap deferred 2025.    BP elevated at clinic.  D/w pt about checking BP out of clinic.    Hearing well.  Vision is good, with occ reading glasses use.  No falls.  Mood is good.  Still working, d/w pt.    No CP.  Still dealing with ankle swelling, is going to keep using compression stockings.   Diet and exercise d/w pt.    Discussed previous endocrine evaluation, recheck thyroid labs pending.  PMH and SH reviewed  Meds, vitals, and allergies reviewed.   ROS: Per HPI.  Unless specifically indicated otherwise in HPI, the patient denies:  General: fever. Eyes: acute vision changes ENT: sore throat Cardiovascular: chest pain Respiratory: SOB GI: vomiting GU: dysuria Musculoskeletal: acute back pain Derm: acute rash Neuro: acute motor dysfunction Psych: worsening mood Endocrine: polydipsia Heme: bleeding Allergy: hayfever  GEN: nad, alert and oriented HEENT: mucous membranes moist NECK: supple w/o LA CV: rrr. PULM: ctab, no inc wob ABD: soft, +bs EXT: 1+ BLE edema SKIN: no acute rash

## 2023-07-26 NOTE — Patient Instructions (Addendum)
 Check your BP and let me know if persistently above 140/90.  Take care.  Glad to see you. Go to the lab on the way out.   If you have mychart we'll likely use that to update you.     Please call [facility]  [address], [place], [place] [address] Phone: [phone]  Tetanus when possible.  Pneumonia vaccine today.

## 2023-07-27 LAB — LIPID PANEL
Cholesterol: 157 mg/dL (ref <200)
HDL: 64 mg/dL (ref >=50)
LDL Cholesterol (Calc): 81 mg/dL
Non-HDL Cholesterol (Calc): 93 mg/dL (ref <130)
Total CHOL/HDL Ratio: 2.5 (calc) (ref <5.0)
Triglycerides: 42 mg/dL (ref <150)

## 2023-07-27 LAB — COMPREHENSIVE METABOLIC PANEL WITH GFR
AG Ratio: 1.3 (calc) (ref 1.0–2.5)
ALT: 14 U/L (ref 6–29)
AST: 17 U/L (ref 10–35)
Albumin: 4.1 g/dL (ref 3.6–5.1)
Alkaline phosphatase (APISO): 79 U/L (ref 37–153)
BUN: 11 mg/dL (ref 7–25)
CO2: 28 mmol/L (ref 20–32)
Calcium: 9.4 mg/dL (ref 8.6–10.4)
Chloride: 103 mmol/L (ref 98–110)
Creat: 0.67 mg/dL (ref 0.50–1.05)
Globulin: 3.2 g/dL (ref 1.9–3.7)
Glucose, Bld: 85 mg/dL (ref 65–99)
Potassium: 3.9 mmol/L (ref 3.5–5.3)
Sodium: 140 mmol/L (ref 135–146)
Total Bilirubin: 0.7 mg/dL (ref 0.2–1.2)
Total Protein: 7.3 g/dL (ref 6.1–8.1)
eGFR: 97 mL/min/{1.73_m2} (ref >=60)

## 2023-07-27 LAB — T4, FREE: Free T4: 1.2 ng/dL (ref 0.8–1.8)

## 2023-07-27 LAB — TSH: TSH: 2.34 m[IU]/L (ref 0.40–4.50)

## 2023-07-28 NOTE — Assessment & Plan Note (Signed)
Living will d/w pt.  Sister Jackie designated if patient were incapacitated. ?

## 2023-07-28 NOTE — Assessment & Plan Note (Signed)
 DXA and mammogram ordered 2025.  PNA-20, done 2025.   Shingles d/w pt.   Tetanus 2012, d/w pt.   Flu shot d/w pt.  Done at work.   covid vaccine- Moderna x2 2021 Living will d/w pt.  Sister Fredrik Jensen designated if patient were incapacitated.   Cologuard neg 2023.   Pap deferred 2025.

## 2023-07-28 NOTE — Assessment & Plan Note (Signed)
  BP elevated at clinic.  D/w pt about checking BP out of clinic.

## 2023-07-28 NOTE — Assessment & Plan Note (Signed)
 Discussed using compression stockings.  Diet and exercise discussed with patient.  See notes on labs.

## 2023-10-21 ENCOUNTER — Telehealth: Payer: Self-pay | Admitting: Family Medicine

## 2023-10-21 NOTE — Telephone Encounter (Signed)
 Copied from CRM [id number]. Topic: Referral - Question >> Oct 21, 2023 12:14 PM Mesmerise C wrote: Reason for CRM: Patient stated Dr. Cleatus gave her an order to have mammogram and bone density tests to be done at [facility] radiology and inquiring does she need a referral to be seen to set up a appointment patient can be reached [phone]

## 2023-10-21 NOTE — Telephone Encounter (Signed)
 Called patient let know that order is in she should be able to call and set up exam. If not get a good fax number for them and call back and we will resend.

## 2023-12-02 NOTE — Telephone Encounter (Signed)
 Patient called to schedule her mammogram with [facility] (phone: [phone])   Need orders faxed to: [phone]  [facility] order# [id number] Dexa order# [id number]

## 2023-12-24 ENCOUNTER — Other Ambulatory Visit: Payer: Self-pay

## 2023-12-24 DIAGNOSIS — Z1231 Encounter for screening mammogram for malignant neoplasm of breast: Secondary | ICD-10-CM

## 2023-12-24 NOTE — Telephone Encounter (Signed)
 Left message for patient to advise that orders have been faxed to [facility]
